# Patient Record
Sex: Male | Born: 1965 | Race: White | Hispanic: No | Smoking: Never smoker
Health system: Southern US, Community
[De-identification: ages and names within clinical notes are randomized; demographics above are authoritative.]

## PROBLEM LIST (undated history)

## (undated) DIAGNOSIS — E669 Obesity, unspecified: Secondary | ICD-10-CM

## (undated) HISTORY — PX: KNEE SURGERY: SHX244

## (undated) HISTORY — DX: Obesity, unspecified: E66.9

---

## 2016-10-15 ENCOUNTER — Encounter (HOSPITAL_COMMUNITY): Payer: Self-pay | Admitting: Emergency Medicine

## 2016-10-15 ENCOUNTER — Emergency Department (HOSPITAL_COMMUNITY)
Admission: EM | Admit: 2016-10-15 | Discharge: 2016-10-15 | Disposition: A | Discharge status: Home / Self Care | Payer: BC Managed Care – PPO | Attending: Emergency Medicine | Admitting: Emergency Medicine

## 2016-10-15 ENCOUNTER — Emergency Department (HOSPITAL_COMMUNITY): Payer: BC Managed Care – PPO

## 2016-10-15 DIAGNOSIS — R0789 Other chest pain: Secondary | ICD-10-CM | POA: Insufficient documentation

## 2016-10-15 DIAGNOSIS — R0602 Shortness of breath: Secondary | ICD-10-CM | POA: Insufficient documentation

## 2016-10-15 DIAGNOSIS — R079 Chest pain, unspecified: Secondary | ICD-10-CM

## 2016-10-15 LAB — COMPREHENSIVE METABOLIC PANEL
ALT: 31 U/L (ref 17–63)
ANION GAP: 6 (ref 5–15)
AST: 26 U/L (ref 15–41)
Albumin: 4 g/dL (ref 3.5–5.0)
Alkaline Phosphatase: 96 U/L (ref 38–126)
BILIRUBIN TOTAL: 1.4 mg/dL — AB (ref 0.3–1.2)
BUN: 12 mg/dL (ref 6–20)
CHLORIDE: 104 mmol/L (ref 101–111)
CO2: 29 mmol/L (ref 22–32)
Calcium: 9.1 mg/dL (ref 8.9–10.3)
Creatinine, Ser: 1.37 mg/dL — ABNORMAL HIGH (ref 0.61–1.24)
GFR, EST NON AFRICAN AMERICAN: 58 mL/min — AB (ref >60)
Glucose, Bld: 103 mg/dL — ABNORMAL HIGH (ref 65–99)
POTASSIUM: 4.7 mmol/L (ref 3.5–5.1)
Sodium: 139 mmol/L (ref 135–145)
TOTAL PROTEIN: 6.4 g/dL — AB (ref 6.5–8.1)

## 2016-10-15 LAB — I-STAT TROPONIN, ED
TROPONIN I, POC: 0 ng/mL (ref 0.00–0.08)
TROPONIN I, POC: 0 ng/mL (ref 0.00–0.08)

## 2016-10-15 LAB — CBC
HEMATOCRIT: 46.7 % (ref 39.0–52.0)
Hemoglobin: 16.2 g/dL (ref 13.0–17.0)
MCH: 30.5 pg (ref 26.0–34.0)
MCHC: 34.7 g/dL (ref 30.0–36.0)
MCV: 87.9 fL (ref 78.0–100.0)
PLATELETS: 190 10*3/uL (ref 150–400)
RBC: 5.31 MIL/uL (ref 4.22–5.81)
RDW: 13.1 % (ref 11.5–15.5)
WBC: 6 10*3/uL (ref 4.0–10.5)

## 2016-10-15 LAB — BRAIN NATRIURETIC PEPTIDE: B Natriuretic Peptide: 6.3 pg/mL (ref 0.0–100.0)

## 2016-10-15 LAB — LIPASE, BLOOD: LIPASE: 17 U/L (ref 11–51)

## 2016-10-15 MED ORDER — PANTOPRAZOLE SODIUM 20 MG PO TBEC
20.0000 mg | DELAYED_RELEASE_TABLET | Freq: Once | ORAL | Status: AC
Start: 2016-10-15 — End: 2016-10-15
  Administered 2016-10-15: 20 mg via ORAL
  Filled 2016-10-15: qty 1

## 2016-10-15 MED ORDER — ASPIRIN 81 MG PO CHEW: 81.0000 mg | CHEWABLE_TABLET | Freq: Every day | ORAL | 0 refills | Status: DC

## 2016-10-15 MED ORDER — ASPIRIN 81 MG PO CHEW
324.0000 mg | CHEWABLE_TABLET | Freq: Once | ORAL | Status: AC
  Administered 2016-10-15: 324 mg via ORAL
  Filled 2016-10-15: qty 4

## 2016-10-15 MED ORDER — PANTOPRAZOLE SODIUM 20 MG PO TBEC: 20.0000 mg | DELAYED_RELEASE_TABLET | Freq: Every day | ORAL | 0 refills | Status: DC

## 2016-10-15 NOTE — ED Provider Notes (Signed)
[facility]ER[facility]ENCY DEPT Provider Note   CSN: [id number] Arrival date & time: 10/15/16  3086     History   Chief Complaint Chief Complaint  Patient presents with  . Chest Pain    HPI Devin Maddox is a 51 y.o. male.  HPI Patient started noticing chest pain 2 weeks ago. It was a pressure in the center of his chest. It was intermittent. It lasted hour or so at a time. It could occur several times per day. Not necessarily associated with exertion. No associated symptoms. Patient reports that the pain seemed worse yesterday evening. It started while watching television. It was a more intense pressure in the center of his chest. He felt some degree of nausea. Due to the pain he changed his plan to go play floor hockey which she would normally do. This morning he again developed chest pain with some nausea and felt more concerned and decided to seek treatment. He has not taken any aspirin. Patient was taking ibuprofen and prednisone for a low back injury that occurred several months ago. He reports he has not taken any however for over 2 weeks. History reviewed. No pertinent past medical history.  There are no active problems to display for this patient.   Past Surgical History:  Procedure Laterality Date  . KNEE SURGERY         Home Medications    Prior to Admission medications   Medication Sig Start Date End Date Taking? Authorizing Provider  aspirin 81 MG chewable tablet Chew 1 tablet (81 mg total) by mouth daily. 10/15/16   Arby Barrette, MD  pantoprazole (PROTONIX) 20 MG tablet Take 1 tablet (20 mg total) by mouth daily. 10/15/16   Arby Barrette, MD    Family History Brother had a "massive" heart attack at age 34. Both parents had hypertension.  Social History Social History  Substance Use Topics  . Smoking status: Never Smoker  . Smokeless tobacco: Not on file  . Alcohol use No     Allergies   Patient has no known allergies.   Review of Systems Review of  Systems 10 Systems reviewed and are negative for acute change except as noted in the HPI.   Physical Exam Updated Vital Signs BP 136/79   Pulse 68   Temp 98.5 F (36.9 C) (Oral)   Resp 16   Ht 6' (1.829 m)   Wt 255 lb (115.7 kg)   SpO2 92%   BMI 34.58 kg/m   Physical Exam  Constitutional: He appears well-developed and well-nourished.  HENT:  Head: Normocephalic and atraumatic.  Eyes: Conjunctivae are normal.  Neck: Neck supple.  Cardiovascular: Normal rate and regular rhythm.   No murmur heard. Pulmonary/Chest: Effort normal and breath sounds normal. No respiratory distress. He exhibits no tenderness.  Abdominal: Soft. There is no tenderness.  Musculoskeletal: He exhibits no edema or tenderness.  Neurological: He is alert.  Skin: Skin is warm and dry.  Psychiatric: He has a normal mood and affect.  Nursing note and vitals reviewed.    ED Treatments / Results  Labs (all labs ordered are listed, but only abnormal results are displayed) Labs Reviewed  COMPREHENSIVE METABOLIC PANEL - Abnormal; Notable for the following:       Result Value   Glucose, Bld 103 (*)    Creatinine, Ser 1.37 (*)    Total Protein 6.4 (*)    Total Bilirubin 1.4 (*)    GFR calc non Af Amer 58 (*)    All other  components within normal limits  CBC  BRAIN NATRIURETIC PEPTIDE  LIPASE, BLOOD  I-STAT TROPOININ, ED  I-STAT TROPOININ, ED    EKG  EKG Interpretation  Date/Time:  Tuesday October 15 2016 09:39:17 EST Ventricular Rate:  68 PR Interval:    QRS Duration: 80 QT Interval:  373 QTC Calculation: 397 R Axis:   23 Text Interpretation:  Sinus rhythm Low voltage, precordial leads agree. otherwise normal no old to compare Confirmed by Donnald GarrePfeiffer, MD, Lebron ConnersMarcy [phone]([id number]) on 10/15/2016 10:01:13 AM       Radiology Dg Chest 2 View  Result Date: 10/15/2016 CLINICAL DATA:  Central chest pain for couple weeks intensified last night, shortness of breath with exertion, nausea, nonsmoker EXAM: CHEST   2 VIEW COMPARISON:  None FINDINGS: Normal heart size, mediastinal contours, and pulmonary vascularity. Mild RIGHT basilar atelectasis. Lungs otherwise clear. No pleural effusion or pneumothorax. Bones unremarkable. IMPRESSION: Mild RIGHT basilar atelectasis. Electronically Signed   By: Ulyses SouthwardMark  Boles M.D.   On: 10/15/2016 10:50    Procedures Procedures (including critical care time)  Medications Ordered in ED Medications  aspirin chewable tablet 324 mg (324 mg Oral Given 10/15/16 1044)  pantoprazole (PROTONIX) EC tablet 20 mg (20 mg Oral Given 10/15/16 1116)     Initial Impression / Assessment and Plan / ED Course  I have reviewed the triage vital signs and the nursing notes.  Pertinent labs & imaging results that were available during my care of the patient were reviewed by me and considered in my medical decision making (see chart for details).       Final Clinical Impressions(s) / ED Diagnoses   Final diagnoses:  Nonspecific chest pain   Patient has ruled out for MI. EKG does not show ischemic changes. The patient has had chest discomfort at rest without associated ischemic type symptoms. No personal history of hypertension tobacco use or diabetes. Heart score 2. At this time, plan will be to take a daily aspirin and a daily protonix until follow-up is arranged with cardiology for outpatient stress testing. Patient is counseled signs and symptoms for which return. New Prescriptions New Prescriptions   ASPIRIN 81 MG CHEWABLE TABLET    Chew 1 tablet (81 mg total) by mouth daily.   PANTOPRAZOLE (PROTONIX) 20 MG TABLET    Take 1 tablet (20 mg total) by mouth daily.     Arby BarretteMarcy Kaulana Brindle, MD 10/15/16 [phone]

## 2016-10-15 NOTE — Discharge Instructions (Signed)
Take a daily aspirin and daily protonix until you have had further evaluation with cardiac stress testing. Return to the ER if you develop worsening pain or any associated symptoms of lightheadedness, sweating, nausea or shortness of breath.

## 2016-10-15 NOTE — ED Triage Notes (Signed)
Pt reports central chest pain, ongoing for the past couple of weeks, reports pain intensified last night. C/o sob with exertion as well as some nausea. Pt a/o, resp e/u, skin warm and dry.

## 2016-11-21 DIAGNOSIS — E669 Obesity, unspecified: Secondary | ICD-10-CM | POA: Insufficient documentation

## 2017-05-27 ENCOUNTER — Ambulatory Visit (INDEPENDENT_AMBULATORY_CARE_PROVIDER_SITE_OTHER): Payer: BC Managed Care – PPO | Admitting: Licensed Clinical Social Worker

## 2017-05-27 ENCOUNTER — Encounter (INDEPENDENT_AMBULATORY_CARE_PROVIDER_SITE_OTHER): Payer: Self-pay

## 2017-05-27 ENCOUNTER — Encounter (HOSPITAL_COMMUNITY): Payer: Self-pay | Admitting: Licensed Clinical Social Worker

## 2017-05-27 DIAGNOSIS — F331 Major depressive disorder, recurrent, moderate: Secondary | ICD-10-CM | POA: Insufficient documentation

## 2017-05-27 NOTE — Progress Notes (Signed)
Comprehensive Clinical Assessment (CCA) Note  05/27/2017 Devin Maddox [id number]  Visit Diagnosis:      ICD-10-CM   1. Major depressive disorder, recurrent episode, moderate (HCC) F33.1       CCA Part One  Part One has been completed on paper by the patient.  (See scanned document in Chart Review)  CCA Part Two A  Intake/Chief Complaint:  CCA Intake With Chief Complaint CCA Part Two Date: 05/27/17 CCA Part Two Time: 1316 Chief Complaint/Presenting Problem: Pt is self referred for depression. Pt has a 53 yo disabled daughter with Retts syndrome. Pts wife died 7 years ago, son tried to commit suicide 3 mos after his mother died. Mother just died 2 weeks. Father passed away 3 years ago. Moved to [place] in 2016 from [place]. Brother lives in [place].  3 brother are also deceased. 3 other siblings live in [place] area.. Never been to therapy. Never been on any psychiatric medication Patients Currently Reported Symptoms/Problems: Lots of deaths Ive never dealt with, overwhelmed with life, teaches at [employer] Collateral Involvement: None Individual's Strengths: educated motivated Individual's Preferences: prefers to feel better Individual's Abilities: ability to work a Investment banker, corporate of recovery Type of Services Patient Feels Are Needed: unsure  Mental Health Symptoms Depression:  Depression: Change in energy/activity, Difficulty Concentrating, Fatigue, Hopelessness, Increase/decrease in appetite, Irritability, Sleep (too much or little), Tearfulness  Mania:     Anxiety:      Psychosis:     Trauma:  Trauma: Detachment from others, Emotional numbing, Irritability/anger (many family deaths)  Obsessions:     Compulsions:     Inattention:     Hyperactivity/Impulsivity:     Oppositional/Defiant Behaviors:     Borderline Personality:     Other Mood/Personality Symptoms:      Mental Status Exam Appearance and self-care  Stature:  Stature: Average  Weight:  Weight: Average weight  Clothing:   Clothing: Casual  Grooming:  Grooming: Normal  Cosmetic use:  Cosmetic Use: None  Posture/gait:  Posture/Gait: Normal  Motor activity:  Motor Activity: Not Remarkable  Sensorium  Attention:  Attention: Distractible  Concentration:  Concentration: Preoccupied  Orientation:  Orientation: X5  Recall/memory:  Recall/Memory: Defective in short-term  Affect and Mood  Affect:  Affect: Depressed  Mood:  Mood: Depressed  Relating  Eye contact:  Eye Contact: Normal  Facial expression:  Facial Expression: Depressed  Attitude toward examiner:  Attitude Toward Examiner: Cooperative  Thought and Language  Speech flow: Speech Flow: Normal  Thought content:  Thought Content: Appropriate to mood and circumstances  Preoccupation:  Preoccupations: Ruminations  Hallucinations:     Organization:     Company secretary of Knowledge:  Fund of Knowledge: Impoverished by:  (Comment)  Intelligence:  Intelligence: Average  Abstraction:  Abstraction: Normal  Judgement:  Judgement: Normal  Reality Testing:  Reality Testing: Adequate  Insight:  Insight: Good  Decision Making:  Decision Making: Vacilates  Social Functioning  Social Maturity:  Social Maturity: Responsible  Social Judgement:  Social Judgement: Normal  Stress  Stressors:  Stressors: Grief/losses, Transitions, Work (care of daughter)  Coping Ability:  Coping Ability: Exhausted, Building surveyor Deficits:     Supports:      Family and Psychosocial History: Family history Marital status: Widowed Widowed, when?: 7 years ago Does patient have children?: Yes How many children?: 2 How is patient's relationship with their children?: son lives in [place], has had some depression and anxiety and tried to commit suicide after mother's death. Daughter  has Retts syndrome and cannot take care of herself  Childhood History:  Childhood History By whom was/is the patient raised?: Both parents Additional childhood history information: I was the   "5th wheel" in the family, I was forgotten, I had 7 siblings Description of patient's relationship with caregiver when they were a child: good Patient's description of current relationship with people who raised him/her: both have passed How were you disciplined when you got in trouble as a child/adolescent?: spakings Does patient have siblings?: Yes Number of Siblings: 4 Description of patient's current relationship with siblings: excellent Did patient suffer any verbal/emotional/physical/sexual abuse as a child?: No Did patient suffer from severe childhood neglect?: No Has patient ever been sexually abused/assaulted/raped as an adolescent or adult?: No Was the patient ever a victim of a crime or a disaster?: No Witnessed domestic violence?: No Has patient been effected by domestic violence as an adult?: No  CCA Part Two B  Employment/Work Situation: Employment / Work Psychologist, occupational Employment situation: Employed Where is patient currently employed?: [employer] How long has patient been employed?: 3 years Patient's job has been impacted by current illness: No What is the longest time patient has a held a job?: 12 years 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 [employer] Has patient ever been in the [employer]?: No Are There Guns or Other Weapons in Your Home?: Yes Are These Weapons Safely Secured?: Yes  Education: Education Did Garment/textile technologist From [employer]?: Yes Did Theme park manager?: Yes What Type of College Degree Do you Have?: BA Education  Religion: Religion/Spirituality Are You A Religious Person?: No  Leisure/Recreation: Leisure / Recreation Leisure and Hobbies: hockey, live music  Exercise/Diet: Exercise/Diet Do You Exercise?: No Have You Gained or Lost A Significant Amount of Weight in the Past Six Months?: No Do You Follow a Special Diet?: No Do You Have Any Trouble Sleeping?: Yes Explanation of Sleeping Difficulties: cant stay asleep  CCA  Part Two C  Alcohol/Drug Use: Alcohol / Drug Use History of alcohol / drug use?: No history of alcohol / drug abuse                      CCA Part Three  ASAM's:  Six Dimensions of Multidimensional Assessment  Dimension 1:  Acute Intoxication and/or Withdrawal Potential:     Dimension 2:  Biomedical Conditions and Complications:     Dimension 3:  Emotional, Behavioral, or Cognitive Conditions and Complications:     Dimension 4:  Readiness to Change:     Dimension 5:  Relapse, Continued use, or Continued Problem Potential:     Dimension 6:  Recovery/Living Environment:      Substance use Disorder (SUD)    Social Function:  Social Functioning Social Maturity: Responsible Social Judgement: Normal  Stress:  Stress Stressors: Grief/losses, Transitions, Work (care of daughter) Coping Ability: Exhausted, Overwhelmed Patient Takes Medications The Way The Doctor Instructed?: NA Priority Risk: Low Acuity  Risk Assessment- Self-Harm Potential: Risk Assessment For Self-Harm Potential Thoughts of Self-Harm: No current thoughts Method: No plan Availability of Means: No access/NA  Risk Assessment -Dangerous to Others Potential: Risk Assessment For Dangerous to Others Potential Method: No Plan Availability of Means: No access or NA Intent: Vague intent or NA  DSM5 Diagnoses: Patient Active Problem List   Diagnosis Date Noted  . Major depressive disorder, recurrent episode, moderate (HCC) 05/27/2017    Patient Centered Plan: Patient is on the following Treatment Plan(s):  Depression  Recommendations for Services/Supports/Treatments: Recommendations for Services/Supports/Treatments Recommendations For Services/Supports/Treatments: Individual Therapy, Medication Management  Treatment Plan Summary:     Referrals to Alternative Service(s): Referred to Alternative Service(s):   Place:   Date:   Time:    Referred to Alternative Service(s):   Place:   Date:   Time:     Referred to Alternative Service(s):   Place:   Date:   Time:    Referred to Alternative Service(s):   Place:   Date:   Time:     Vernona Rieger

## 2017-06-24 ENCOUNTER — Ambulatory Visit (HOSPITAL_COMMUNITY): Payer: Self-pay | Admitting: Licensed Clinical Social Worker

## 2017-07-09 ENCOUNTER — Encounter (HOSPITAL_COMMUNITY): Payer: Self-pay | Admitting: Licensed Clinical Social Worker

## 2017-07-09 ENCOUNTER — Ambulatory Visit (INDEPENDENT_AMBULATORY_CARE_PROVIDER_SITE_OTHER): Payer: BC Managed Care – PPO | Admitting: Licensed Clinical Social Worker

## 2017-07-09 DIAGNOSIS — F331 Major depressive disorder, recurrent, moderate: Secondary | ICD-10-CM | POA: Diagnosis not present

## 2017-07-09 NOTE — Progress Notes (Signed)
   THERAPIST PROGRESS NOTE  Session Time: 1:10-2pm  Participation Level: Active  Behavioral Response: CasualAlertAnxious  Type of Therapy: Individual Therapy  Treatment Goals addressed: Depression  Interventions: Motivational Interviewing  Summary: Jenita Seashoreatrick Rock is a 51 y.o. male who presents for his initial individual counseling session. Spent much of today building a trusting therapeutic relationship. Pt reports his depression is a 6. He sees the psychiatrist for first appt next week. He is agreeable to medication. 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 He feels much less stressed. He still takes care of her disabled daughter with no assistance. His wife passed away 7 years ago. He has a brother who lives in [place] and the rest of his family lives in [place]. He has always felt like the butt of joke in his family, all of his life. [place] catholic [place]. His family is concerned about him with his depressive symptoms and anger issues. The only outlet pt has is playing on a hockey team Monday nights.   Suicidal/Homicidal: Nowithout intent/plan  Therapist Response: Assessed pt's current functioning and reviewed progress. Assisted pt building a trusting therapeutic relationship.  Plan: Return again in 1 month.  Diagnosis: Axis I: MDD, recurrent episode moderate    MACKENZIE,LISBETH S, LCAS 07/09/2017

## 2017-07-15 ENCOUNTER — Ambulatory Visit (HOSPITAL_COMMUNITY): Payer: BC Managed Care – PPO | Admitting: Psychiatry

## 2017-07-15 ENCOUNTER — Encounter (HOSPITAL_COMMUNITY): Payer: Self-pay | Admitting: Psychiatry

## 2017-07-15 VITALS — BP 138/78 | HR 70 | Ht 72.0 in | Wt 255.0 lb

## 2017-07-15 DIAGNOSIS — F419 Anxiety disorder, unspecified: Secondary | ICD-10-CM

## 2017-07-15 DIAGNOSIS — F331 Major depressive disorder, recurrent, moderate: Secondary | ICD-10-CM | POA: Diagnosis not present

## 2017-07-15 DIAGNOSIS — Z79899 Other long term (current) drug therapy: Secondary | ICD-10-CM | POA: Diagnosis not present

## 2017-07-15 DIAGNOSIS — Z818 Family history of other mental and behavioral disorders: Secondary | ICD-10-CM

## 2017-07-15 DIAGNOSIS — Z811 Family history of alcohol abuse and dependence: Secondary | ICD-10-CM

## 2017-07-15 DIAGNOSIS — Z634 Disappearance and death of family member: Secondary | ICD-10-CM

## 2017-07-15 MED ORDER — BUPROPION HCL ER (XL) 150 MG PO TB24: 150.0000 mg | ORAL_TABLET | Freq: Every day | ORAL | 0 refills | Status: AC

## 2017-07-15 NOTE — Progress Notes (Signed)
Psychiatric Initial Adult Assessment   Patient Identification: Devin Maddox MRN:  [id number] Date of Evaluation:  07/15/2017 Referral Source: Self-referred Chief Complaint:  I am depressed.  Visit Diagnosis:    ICD-10-CM   1. MDD (major depressive disorder), recurrent episode, moderate (HCC) F33.1 buPROPion (WELLBUTRIN XL) 150 MG 24 hr tablet    History of Present Illness: Devin Maddox is a 51 year old Caucasian, widowed man who is self-referred for seeking help for his depression.  Patient reported that he has a lot of losses in recent years.  His wife died 7 years ago after battling with ovarian cancer.  She was in the ICU for 6 weeks and he was caretaker for her.  Patient also sole caretaker of 53 year old daughter who has Rett's syndrome.  Soon after the death of the wife his son tried to commit suicide and hospitalized in a psychiatry hospital.  Last few years he lost his brother, lost his father due to stroke and this September lost his mother due to stroke.  Patient also have knee surgery and he continues to have knee pain.  He admitted all these losses causing significant depression.  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  Patient admitted feeling tired, lethargic, lack of motivation, decreased energy, poor sleep, sometimes crying spells but denies any suicidal thoughts or homicidal thoughts.  Patient moved to [place] in 2016 so he can find better place for himself and his daughter.  His daughter is now in special needs school and is very happy for her.  His son is in [place] and doing much better.  Patient has a brother who lives in [place].  Patient never seen psychiatrist and never taken any medication.  He just started seeing Lavada MesiMcKenzie Beth for counseling.  Patient never seen grief counseling after the death of his wife.  He admitted sometimes feeling excessive sadness, rumination, irritability, racing thoughts and  poor concentration.  Patient denies drinking or using any illegal substances.  He is open to try antidepressant.  He will also continue counseling with Lavada MesiBeth McKenzie.    Associated Signs/Symptoms: Depression Symptoms:  depressed mood, insomnia, fatigue, difficulty concentrating, hopelessness, anxiety, loss of energy/fatigue, disturbed sleep, (Hypo) Manic Symptoms:  Irritable Mood, Anxiety Symptoms:  Excessive Worry, Psychotic Symptoms:  No psychotic symptoms PTSD Symptoms: Negative  Past Psychiatric History: Patient denies any history of psychiatric inpatient treatment or any suicidal attempt.  He has never seen a psychiatrist before.  He had never taken any antidepressant or any psychotropic medication.  Previous Psychotropic Medications: No   Substance Abuse History in the last 12 months:  No.  Consequences of Substance Abuse: Negative  Past Medical History:  Past Medical History:  Diagnosis Date  . Obesity     Past Surgical History:  Procedure Laterality Date  . KNEE SURGERY      Family Psychiatric History: Patient reported one brother has depression and alcohol problem.  Family History:  Family History  Problem Relation Age of Onset  . Depression Brother     Social History:   Social History   Socioeconomic History  . Marital status: Widowed    Spouse name: None  . Number of children: None  . Years of education: None  . Highest education level: None  Social Needs  . Financial resource strain: None  . Food insecurity - worry: None  . Food insecurity - inability: None  . Transportation needs - medical: None  . Transportation needs -  non-medical: None  Occupational History  . None  Tobacco Use  . Smoking status: Never Smoker  . Smokeless tobacco: Never Used  Substance and Sexual Activity  . Alcohol use: No  . Drug use: No  . Sexual activity: None  Other Topics Concern  . None  Social History Narrative  . None    Additional Social History:  Patient born and raised in [place].  He has 7 other siblings.  Three of his brother deceased.  One of his brother lives in [place].  Patient had a good childhood.  He was involved in sports and he wanted to join [employer] and after the selection he injured his knee.  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  Allergies:  No Known Allergies  Metabolic Disorder Labs: No results found for: HGBA1C, MPG No results found for: PROLACTIN No results found for: CHOL, TRIG, HDL, CHOLHDL, VLDL, LDLCALC   Current Medications: Current Outpatient Medications  Medication Sig Dispense Refill  . aspirin 81 MG chewable tablet Chew 1 tablet (81 mg total) by mouth daily. 30 tablet 0  . pantoprazole (PROTONIX) 20 MG tablet Take 1 tablet (20 mg total) by mouth daily. 30 tablet 0   No current facility-administered medications for this visit.     Neurologic: Headache: No Seizure: No Paresthesias:No  Musculoskeletal: Strength & Muscle Tone: within normal limits Gait & Station: normal Patient leans: N/A  Psychiatric Specialty Exam: Review of Systems  Constitutional: Negative.   HENT: Negative.   Respiratory: Negative.   Cardiovascular: Negative.   Genitourinary: Negative.   Musculoskeletal: Positive for joint pain.  Skin: Negative.   Neurological: Negative.   Psychiatric/Behavioral: Positive for depression. The patient has insomnia.     Blood pressure 138/78, pulse 70, height 6' (1.829 m), weight 255 lb (115.7 kg).There is no height or weight on file to calculate BMI.  General Appearance: Well Groomed  Eye Contact:  Good  Speech:  Clear and Coherent  Volume:  Normal  Mood:  Depressed and Dysphoric  Affect:  Constricted  Thought Process:  Goal Directed  Orientation:  Full (Time, Place, and Person)  Thought Content:  Logical and Rumination  Suicidal Thoughts:  No  Homicidal Thoughts:  No  Memory:   Immediate;   Good Recent;   Good Remote;   Good  Judgement:  Good  Insight:  Good  Psychomotor Activity:  Decreased  Concentration:  Concentration: Fair and Attention Span: Fair  Recall:  Good  Fund of Knowledge:Good  Language: Good  Akathisia:  No  Handed:  Right  AIMS (if indicated):  0  Assets:  Communication Skills Desire for Improvement Housing Physical Health Transportation  ADL's:  Intact  Cognition: WNL  Sleep:  fair   Assessment: Major depressive disorder, recurrent. Anxiety disorder NOS  Plan: I review his psychosocial stressors, current medication, history and collect information.  Patient is experiencing significant depression.  Recommended to try Wellbutrin XL 150 mg daily to help with mood lability and depression.  Discussed medication side effect especially in the beginning headaches, tremors and shakes.  Encouraged to continue counseling with [facility] for [facility]BT.  Discussed safety concerns at any time having active suicidal thoughts or homicidal thought that he need to call 911 or go to local emergency room.  Encouraged to see his primary care physician for physical and blood work.  Patient has not done physical and routine blood work in a while.  He need TSH, CBC hemoglobin A1c and comprehensive metabolic panel.  I recommended to call us back if is any question, concern or if he feels worsening of the symptoms.  Follow-up in 3 weeks.  Cleotis NipperSyed T Fatih Stalvey, MD 11/27/20189:44 AM

## 2017-07-23 ENCOUNTER — Ambulatory Visit (HOSPITAL_COMMUNITY): Payer: Self-pay | Admitting: Licensed Clinical Social Worker

## 2017-08-05 ENCOUNTER — Ambulatory Visit (HOSPITAL_COMMUNITY): Payer: Self-pay | Admitting: Licensed Clinical Social Worker

## 2017-08-08 ENCOUNTER — Ambulatory Visit (HOSPITAL_COMMUNITY): Payer: Self-pay | Admitting: Psychiatry

## 2017-08-28 ENCOUNTER — Ambulatory Visit (HOSPITAL_COMMUNITY): Payer: Self-pay | Admitting: Licensed Clinical Social Worker

## 2018-02-27 IMAGING — CR DG CHEST 2V
2 series · 2 of 2 positions shown · non-contrast
Comparison: None

CLINICAL DATA: Central chest pain for couple weeks intensified last
night, shortness of breath with exertion, nausea, nonsmoker

EXAM:
CHEST  2 VIEW

[chest pa]
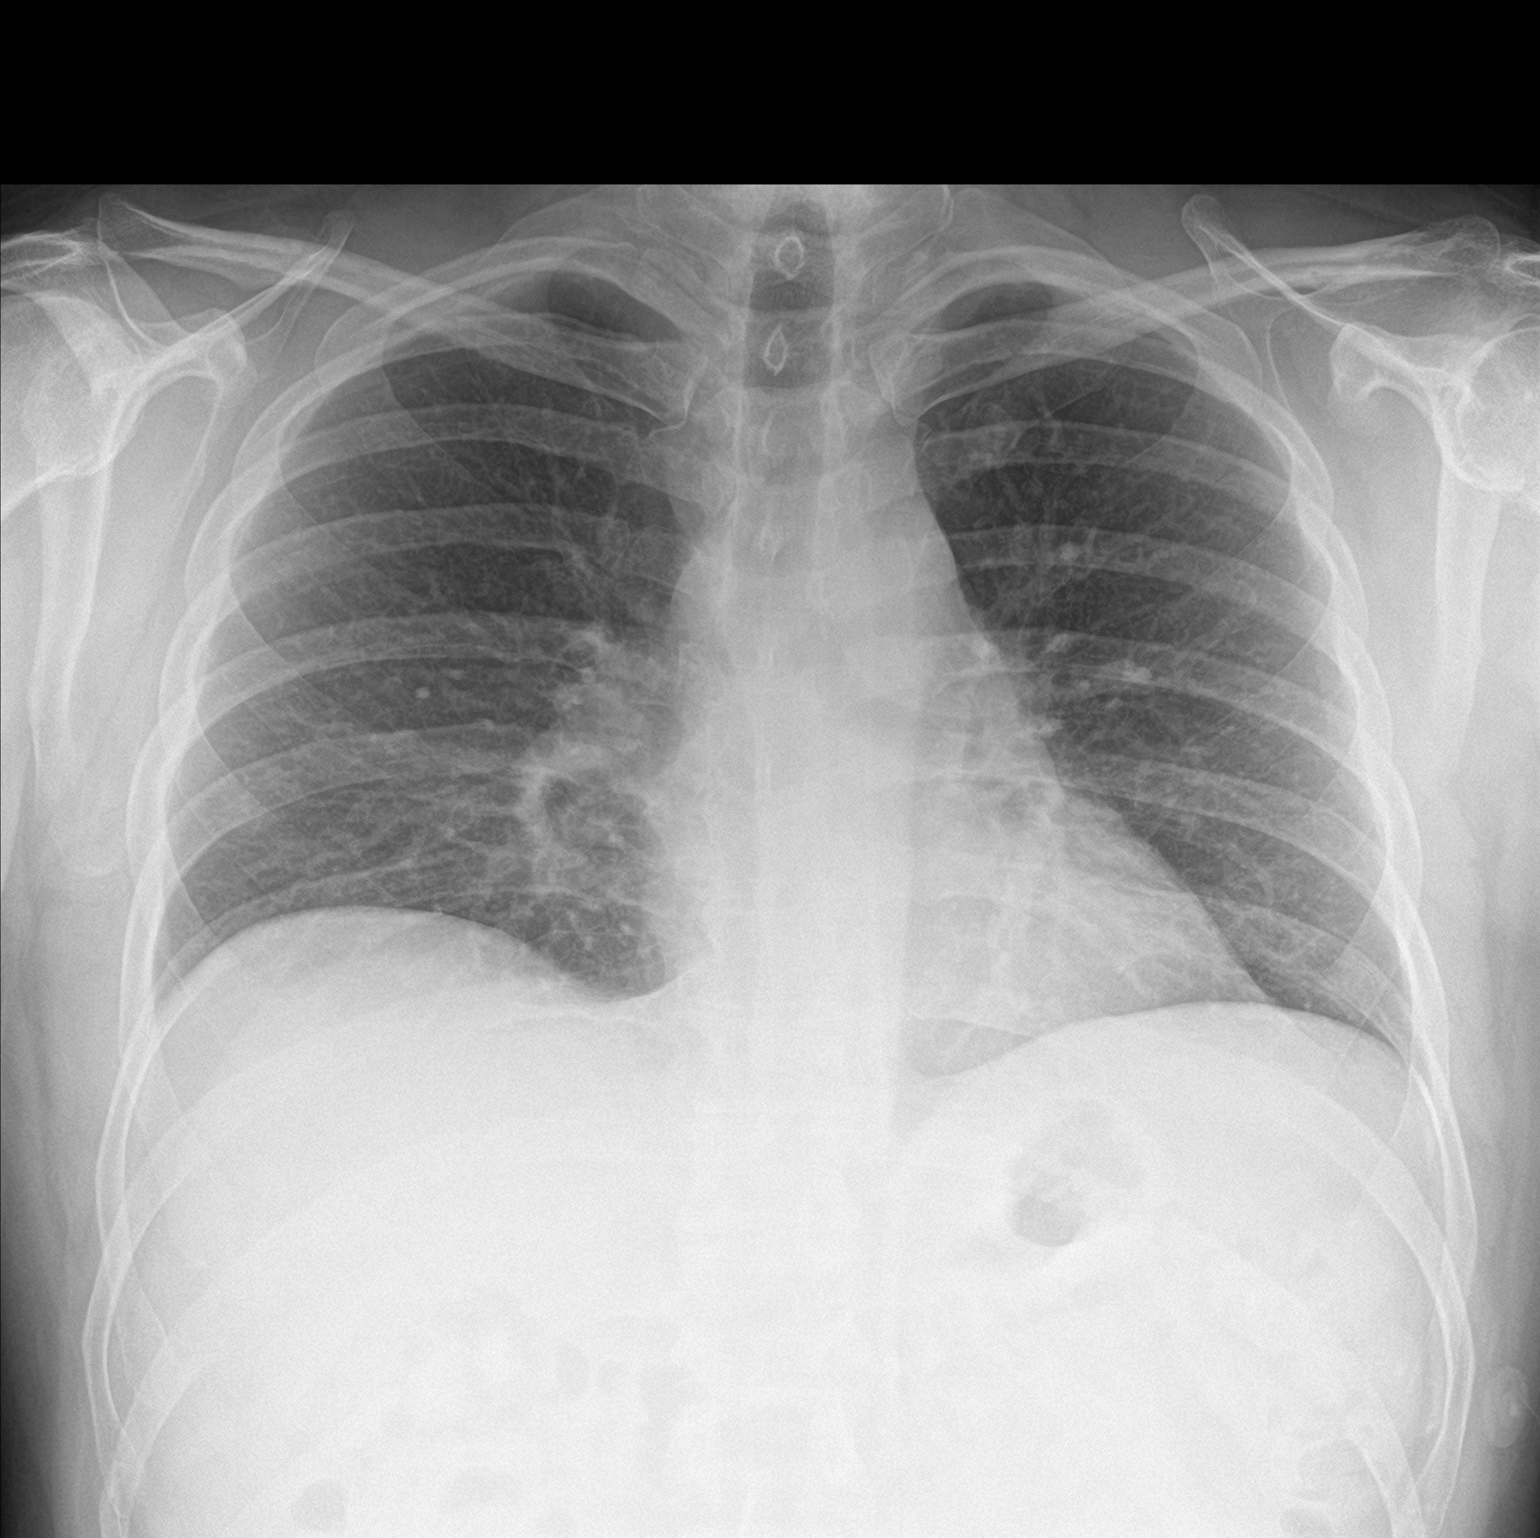

[chest lat]
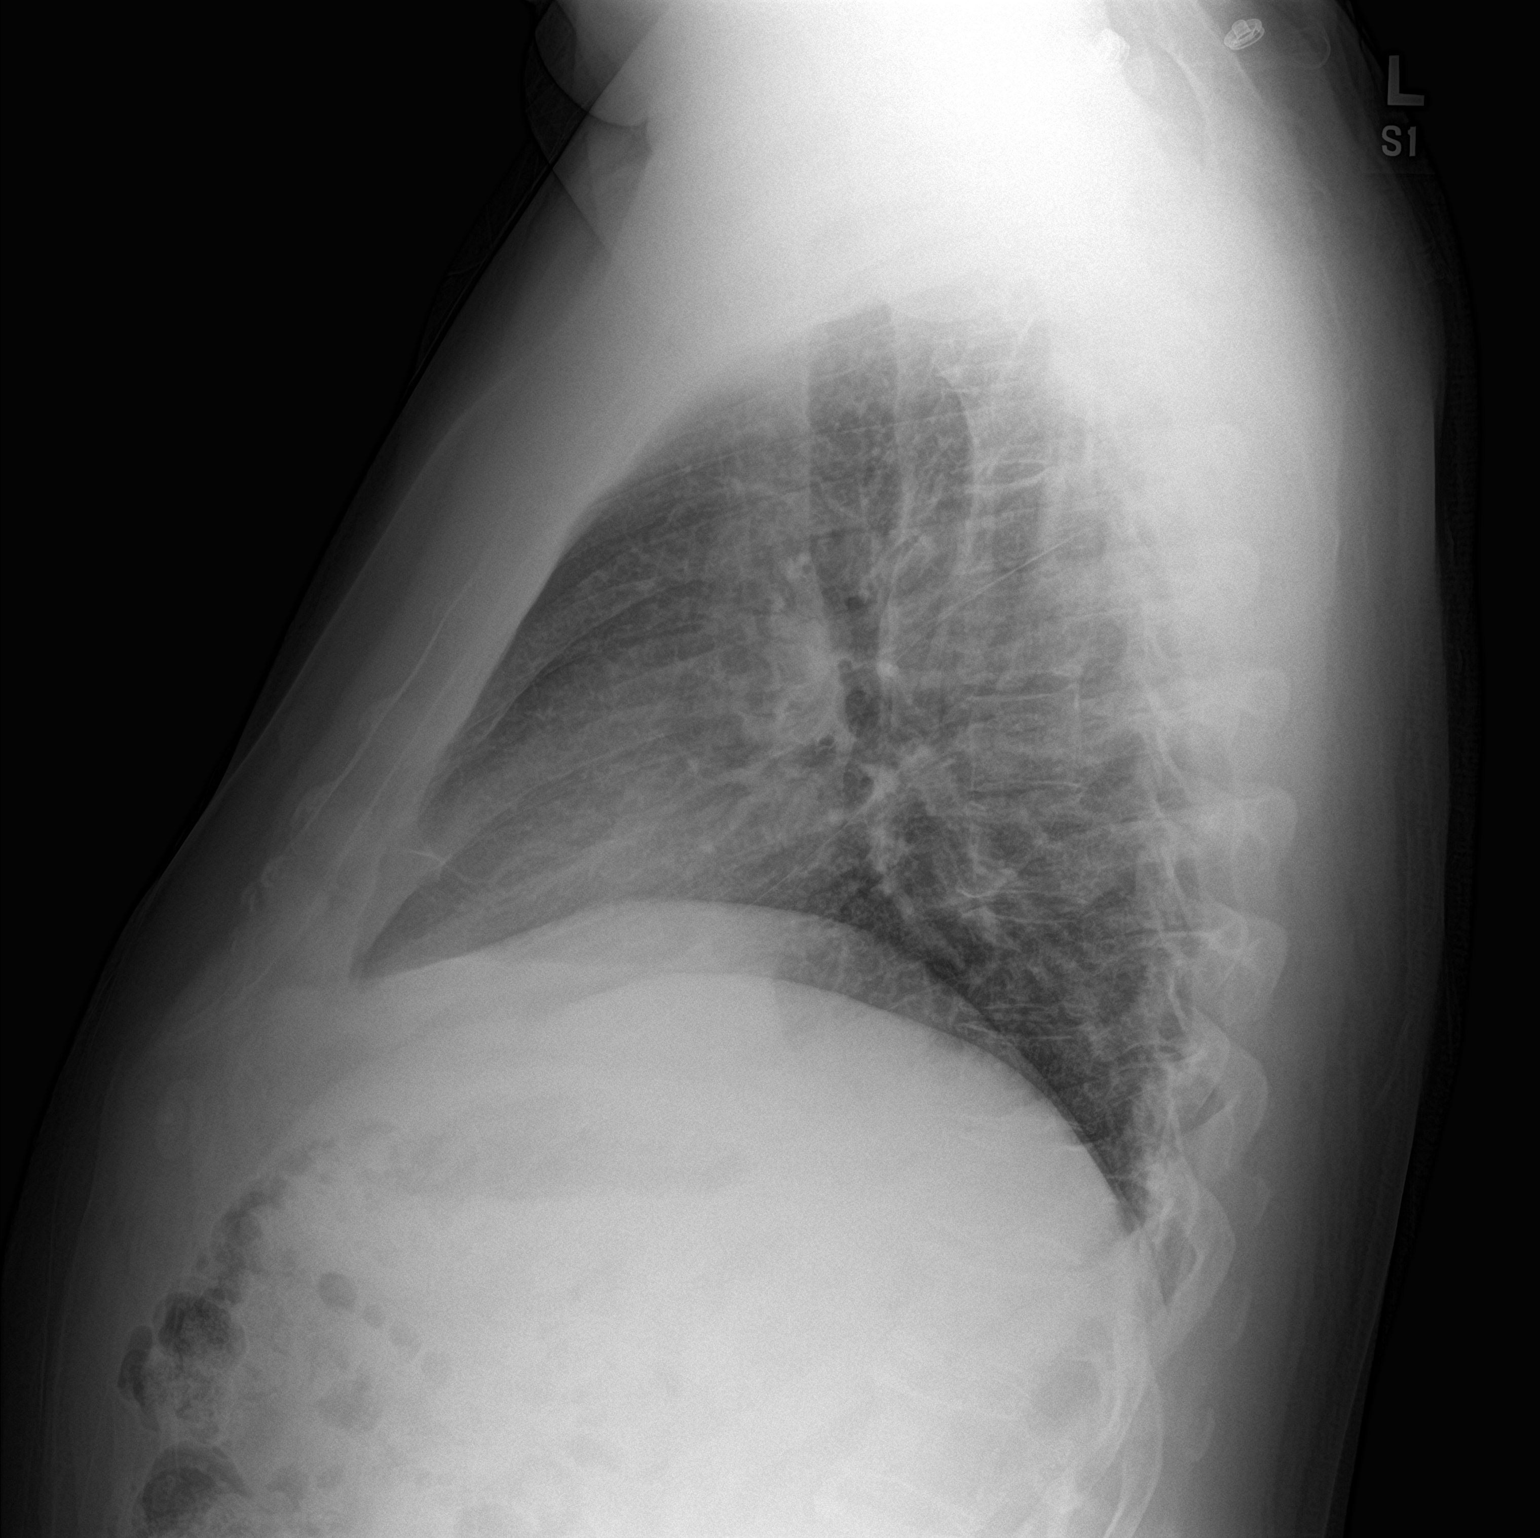

[2 of 2 positions shown; findings below may reference images not displayed]

FINDINGS: Normal heart size, mediastinal contours, and pulmonary vascularity.

Mild RIGHT basilar atelectasis.

Lungs otherwise clear.

No pleural effusion or pneumothorax.

Bones unremarkable.
IMPRESSION: Mild RIGHT basilar atelectasis.
